# Patient Record
Sex: Female | Born: 1970 | Race: White | Hispanic: No | Marital: Married | Smoking: Never smoker
Health system: Southern US, Community
[De-identification: ages and names within clinical notes are randomized; demographics above are authoritative.]

## PROBLEM LIST (undated history)

## (undated) DIAGNOSIS — D691 Qualitative platelet defects: Secondary | ICD-10-CM

## (undated) DIAGNOSIS — R112 Nausea with vomiting, unspecified: Secondary | ICD-10-CM

## (undated) DIAGNOSIS — C801 Malignant (primary) neoplasm, unspecified: Secondary | ICD-10-CM

## (undated) DIAGNOSIS — D649 Anemia, unspecified: Secondary | ICD-10-CM

## (undated) DIAGNOSIS — Z9889 Other specified postprocedural states: Secondary | ICD-10-CM

## (undated) HISTORY — PX: OTHER SURGICAL HISTORY: SHX169

## (undated) HISTORY — PX: TUBAL LIGATION: SHX77

## (undated) HISTORY — PX: TONSILLECTOMY: SUR1361

## (undated) HISTORY — PX: CHOLECYSTECTOMY: SHX55

---

## 2014-01-25 ENCOUNTER — Encounter (HOSPITAL_BASED_OUTPATIENT_CLINIC_OR_DEPARTMENT_OTHER): Payer: Self-pay | Admitting: Emergency Medicine

## 2014-01-25 ENCOUNTER — Emergency Department (HOSPITAL_BASED_OUTPATIENT_CLINIC_OR_DEPARTMENT_OTHER)
Admission: EM | Admit: 2014-01-25 | Discharge: 2014-01-25 | Disposition: A | Discharge status: Home / Self Care | Payer: BC Managed Care – PPO | Attending: Emergency Medicine | Admitting: Emergency Medicine

## 2014-01-25 DIAGNOSIS — R350 Frequency of micturition: Secondary | ICD-10-CM | POA: Insufficient documentation

## 2014-01-25 DIAGNOSIS — Z791 Long term (current) use of non-steroidal anti-inflammatories (NSAID): Secondary | ICD-10-CM | POA: Diagnosis not present

## 2014-01-25 DIAGNOSIS — Z79899 Other long term (current) drug therapy: Secondary | ICD-10-CM | POA: Insufficient documentation

## 2014-01-25 DIAGNOSIS — Z3202 Encounter for pregnancy test, result negative: Secondary | ICD-10-CM | POA: Diagnosis not present

## 2014-01-25 DIAGNOSIS — R300 Dysuria: Secondary | ICD-10-CM | POA: Diagnosis present

## 2014-01-25 DIAGNOSIS — N390 Urinary tract infection, site not specified: Secondary | ICD-10-CM | POA: Diagnosis not present

## 2014-01-25 LAB — URINALYSIS, ROUTINE W REFLEX MICROSCOPIC
Bilirubin Urine: NEGATIVE
GLUCOSE, UA: NEGATIVE mg/dL
KETONES UR: NEGATIVE mg/dL
Nitrite: NEGATIVE
PH: 6.5 (ref 5.0–8.0)
Protein, ur: 30 mg/dL — AB
Specific Gravity, Urine: 1.013 (ref 1.005–1.030)
Urobilinogen, UA: 0.2 mg/dL (ref 0.0–1.0)

## 2014-01-25 LAB — PREGNANCY, URINE: PREG TEST UR: NEGATIVE

## 2014-01-25 LAB — URINE MICROSCOPIC-ADD ON

## 2014-01-25 MED ORDER — NITROFURANTOIN MONOHYD MACRO 100 MG PO CAPS: 100.0000 mg | ORAL_CAPSULE | Freq: Two times a day (BID) | ORAL | Status: DC

## 2014-01-25 MED ORDER — PHENAZOPYRIDINE HCL 200 MG PO TABS
200.0000 mg | ORAL_TABLET | Freq: Three times a day (TID) | ORAL | Status: DC
Start: 2014-01-25 — End: 2016-10-25

## 2014-01-25 MED ORDER — NITROFURANTOIN MONOHYD MACRO 100 MG PO CAPS
100.0000 mg | ORAL_CAPSULE | Freq: Once | ORAL | Status: AC
  Administered 2014-01-25: 100 mg via ORAL
  Filled 2014-01-25: qty 1

## 2014-01-25 MED ORDER — PHENAZOPYRIDINE HCL 100 MG PO TABS
200.0000 mg | ORAL_TABLET | Freq: Once | ORAL | Status: AC
  Administered 2014-01-25: 200 mg via ORAL
  Filled 2014-01-25: qty 2

## 2014-01-25 MED ORDER — IBUPROFEN 800 MG PO TABS: 800.0000 mg | ORAL_TABLET | Freq: Three times a day (TID) | ORAL | Status: DC

## 2014-01-25 MED ORDER — IBUPROFEN 800 MG PO TABS
800.0000 mg | ORAL_TABLET | Freq: Once | ORAL | Status: AC
  Administered 2014-01-25: 800 mg via ORAL
  Filled 2014-01-25: qty 1

## 2014-01-25 NOTE — ED Notes (Signed)
Pt given grape juice and crackers per Dr. Randal Buba.

## 2014-01-25 NOTE — ED Provider Notes (Signed)
CSN: [id number]     Arrival date & time 01/25/14  0224 History   First MD Initiated Contact with Patient 01/25/14 0326     Chief Complaint  Patient presents with  . Dysuria     (Consider location/radiation/quality/duration/timing/severity/associated sxs/prior Treatment) Patient is a 43 y.o. female presenting with dysuria. The history is provided by the patient.  Dysuria Quality: pressure and spasm. Pain severity:  Moderate Onset quality:  Gradual Timing:  Constant Progression:  Worsening Chronicity:  New Recent urinary tract infections: no   Relieved by:  Nothing Worsened by:  Nothing tried Ineffective treatments:  None tried Urinary symptoms: frequent urination and hesitancy   Associated symptoms: no fever and no vaginal discharge   Risk factors: no hx of pyelonephritis     History reviewed. No pertinent past medical history. Past Surgical History  Procedure Laterality Date  . Tonsillectomy    . Cholecystectomy    . Tubal ligation     History reviewed. No pertinent family history. History  Substance Use Topics  . Smoking status: Never Smoker   . Smokeless tobacco: Not on file  . Alcohol Use: No   OB History   Grav Para Term Preterm Abortions TAB SAB Ect Mult Living                 Review of Systems  Constitutional: Negative for fever.  Genitourinary: Positive for dysuria, frequency and difficulty urinating. Negative for vaginal discharge.  All other systems reviewed and are negative.     Allergies  Codeine and Erythromycin  Home Medications   Prior to Admission medications   Medication Sig Start Date End Date Taking? Authorizing Provider  progesterone (PROMETRIUM) 100 MG capsule Take 100 mg by mouth daily.   Yes Historical Provider, MD  ibuprofen (ADVIL,MOTRIN) 800 MG tablet Take 1 tablet (800 mg total) by mouth 3 (three) times daily. 01/25/14   Shavawn Stobaugh K Dawsen Krieger-Rasch, MD  nitrofurantoin, macrocrystal-monohydrate, (MACROBID) 100 MG capsule Take 1  capsule (100 mg total) by mouth 2 (two) times daily. X 7 days 01/25/14   Ayaansh Smail K Neilah Fulwider-Rasch, MD  phenazopyridine (PYRIDIUM) 200 MG tablet Take 1 tablet (200 mg total) by mouth 3 (three) times daily. 01/25/14   Judas Mohammad K Donovin Kraemer-Rasch, MD   BP 123/77  Pulse 75  Temp(Src) 98.2 F (36.8 C) (Oral)  Resp 18  Ht 5\' 5"  (1.651 m)  Wt 135 lb (61.236 kg)  BMI 22.47 kg/m2  SpO2 100%  LMP 01/20/2014 Physical Exam  Constitutional: She is oriented to person, place, and time. She appears well-developed and well-nourished.  HENT:  Head: Normocephalic and atraumatic.  Mouth/Throat: Oropharynx is clear and moist.  Eyes: Conjunctivae are normal. Pupils are equal, round, and reactive to light.  Neck: Normal range of motion. Neck supple.  Cardiovascular: Normal rate, regular rhythm and intact distal pulses.   Pulmonary/Chest: Effort normal and breath sounds normal. She has no wheezes. She has no rales.  Abdominal: Soft. Bowel sounds are normal. There is no tenderness. There is no rebound and no guarding.  Musculoskeletal: Normal range of motion.  Neurological: She is alert and oriented to person, place, and time.  Skin: Skin is warm and dry.  Psychiatric: She has a normal mood and affect.    ED Course  Procedures (including critical care time) Labs Review Labs Reviewed  URINALYSIS, ROUTINE W REFLEX MICROSCOPIC - Abnormal; Notable for the following:    Color, Urine RED (*)    APPearance CLOUDY (*)    Hgb urine dipstick LARGE (*)  Protein, ur 30 (*)    Leukocytes, UA LARGE (*)    All other components within normal limits  PREGNANCY, URINE  URINE MICROSCOPIC-ADD ON    Imaging Review No results found.   EKG Interpretation None      MDM   Final diagnoses:  UTI (lower urinary tract infection)    Patient is having her menstrual cycle and the specimen was not an in and out cath.  Blood is not urinary but menstrual.    macrobid x 7 days pyridium and ibuprofen TID on a full stomach.   Return for vomiting or fevers recheck in 7 days with your regular doctor patient and husband verbalize understanding and agree to follow up    Lorenso Quirino Alfonso Patten, MD 01/25/14 [phone]

## 2014-01-25 NOTE — ED Notes (Signed)
Pt c/o difficulty urinating and lower abd pressure x1 day that became worse tonight. Pt sts she is on day 6 of her period and she is bleeding more than usual.

## 2014-01-25 NOTE — ED Notes (Signed)
MD at bedside. 

## 2014-05-07 ENCOUNTER — Other Ambulatory Visit (HOSPITAL_COMMUNITY)
Admission: RE | Admit: 2014-05-07 | Discharge: 2014-05-07 | Disposition: A | Discharge status: Home / Self Care | Payer: BC Managed Care – PPO | Source: Ambulatory Visit | Attending: Obstetrics & Gynecology | Admitting: Obstetrics & Gynecology

## 2014-05-07 DIAGNOSIS — Z01419 Encounter for gynecological examination (general) (routine) without abnormal findings: Secondary | ICD-10-CM | POA: Diagnosis present

## 2014-05-07 DIAGNOSIS — Z1151 Encounter for screening for human papillomavirus (HPV): Secondary | ICD-10-CM | POA: Insufficient documentation

## 2015-06-05 ENCOUNTER — Other Ambulatory Visit: Payer: Self-pay

## 2015-06-05 DIAGNOSIS — Z1231 Encounter for screening mammogram for malignant neoplasm of breast: Secondary | ICD-10-CM

## 2015-07-08 ENCOUNTER — Ambulatory Visit
Admission: RE | Admit: 2015-07-08 | Discharge: 2015-07-08 | Disposition: A | Discharge status: Home / Self Care | Payer: BC Managed Care – PPO | Source: Ambulatory Visit

## 2015-07-08 DIAGNOSIS — Z1231 Encounter for screening mammogram for malignant neoplasm of breast: Secondary | ICD-10-CM

## 2015-07-09 ENCOUNTER — Ambulatory Visit: Payer: BC Managed Care – PPO

## 2016-08-11 ENCOUNTER — Other Ambulatory Visit: Payer: Self-pay | Admitting: Obstetrics & Gynecology

## 2016-08-11 DIAGNOSIS — N6320 Unspecified lump in the left breast, unspecified quadrant: Secondary | ICD-10-CM

## 2016-08-16 ENCOUNTER — Ambulatory Visit
Admission: RE | Admit: 2016-08-16 | Discharge: 2016-08-16 | Disposition: A | Discharge status: Home / Self Care | Payer: BC Managed Care – PPO | Source: Ambulatory Visit | Attending: Obstetrics & Gynecology | Admitting: Obstetrics & Gynecology

## 2016-08-16 DIAGNOSIS — N6320 Unspecified lump in the left breast, unspecified quadrant: Secondary | ICD-10-CM

## 2016-10-24 ENCOUNTER — Encounter (HOSPITAL_COMMUNITY): Payer: Self-pay | Admitting: *Deleted

## 2016-10-25 NOTE — H&P (Signed)
46yo G2P2 who presents for preop appt for [facility], D&C, uterine polypectomy on 8/3 due to AUB due to uterine polyp. In review, while she has a h/o HMB, which has been well controlled with progesterone. Recently she has noted a change in her bleeding. She reports intermenstrual bleeding- light pink spotting when she urinates throughout the month negative. Frequency unpredictable usually the week after her period and then once she starts the progesterone it resolves. Today she notes no changes from her previous visit. In terms of her menses, she does report that the bleeding while manageable is still heavy- menses last about 7 days, but manageable to her, about 3 heavy days. Reports no acute complaints. No sexual dysfunction. No hot flashes/night sweat. No vaginal discharge, itching or burning.  Last US/SHG 09/14/2016: 9.8cm uterus with 4 small submucosal and intramural fibroids- largest 1.7cm. 2.6cm hypoechoic mass-fundal wall. Left ovary with 2.7cm simple cyst- avascular. Normal right ovary. Prior work up included:  EMB 06/2015- prolif. endometrium with polyp.   Current Medications  Taking   Zyrtec Allergy(Cetirizine HCl) 10 MG Tablet 1 tablet Orally as needed, Notes: seasonal   Progesterone 200 MG Capsule 1 capsule at bedtime Orally Once a day on days 14-25 of cycle.   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   Seasonal allergies.           Surgical History  tonsillectomy 1991  cholecystectomy 1995  BTL 2014  basal cell x 3    Family History  Mother: alive, MI(67), osteoporosis, hypertension  Paternal Brandon Father: deceased  Paternal Grand Mother: deceased  Maternal Grand Father: deceased  Maternal Grand Mother: deceased  denies any GYN family cancer hx.   Social History  General:  Tobacco use   cigarettes: Never smoked Tobacco history last updated 10/25/2016 no EXPOSURE TO PASSIVE SMOKE.  Alcohol: none.  Recreational drug use: no.  Exercise: 1-2 times per week.  Marital Status: married.  Children: 1 daughter Ali Lowe), I son Marshell Levan); 1 stepson Wille Celeste), 1 stepdaughter Salli Real).  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  Religion: Christian.  Seat belt use: yes.    Gyn History  Sexual activity currently sexually active.  Periods : every month.  LMP 10/01/16.  Birth control BTL.  Last pap smear date 05/07/2014.  Last mammogram date 08/16/16.  Denies H/O Abnormal pap smear.    OB History  Pregnancy # 1 live birth, vaginal delivery.  Pregnancy # 2 live birth, vaginal delivery.    Allergies  Erythromycin: vomiting  Codeine (for allergy): vomiting   Hospitalization/Major Diagnostic Procedure  No Hospitalization History.   Review of Systems  CONSTITUTIONAL:  no Appetite changes. no Chills. no Fatigue. no Fever.  CARDIOLOGY:  no Chest pain.  RESPIRATORY:  no Shortness of breath. no Cough.  UROLOGY:  no Dysuria. no Urinary frequency. no Urinary incontinence. no Urinary urgency.  GASTROENTEROLOGY:  no Abdominal pain. no Change in bowel habits. no Change in bowel movements.  FEMALE REPRODUCTIVE:  no Abnormal vaginal discharge. no Breast lumps or discharge. no Breast pain. no Hot flashes. no Sexual problems. no Vaginal irritation. no Vaginal itching.  NEUROLOGY:  no Dizziness. no Headache.  PSYCHOLOGY:  no Anxiety. no Depression.  SKIN:  no Rash. no Hives.  HEMATOLOGY/LYMPH:  no Anemia. Using Blood Thinners no.  Vital Signs  Wt 133, Ht 65.25, BMI 21.96, Pulse sitting 79, BP sitting 104/77.   Examination  General Examination: GENERAL APPEARANCE well developed, well nourished .  SKIN: warm and dry, no rashes .  NECK: supple, normal appearance .  LUNGS: clear to auscultation bilaterally, no wheezes, rhonchi, rales.  HEART: no murmurs, regular  rate and rhythm.  ABDOMEN: soft and not tender, no masses palpated, no rebound, no rigidity.  MUSCULOSKELETAL no calf tenderness bilaterally .  EXTREMITIES: no edema present .  NEUROLOGIC EXAM: alert and oriented x 3.  PSYCH: appropriate mood and affect .      46yo G2P2 who presents for scheduled hysteroscopy, D&C, polypectomy -NPO -LR @ 125cc/hr -SCDs to OR - Discussed procedure and reviewed risk, benefit including but not limited to risk of bleeding infection and injury to uterus or surrounding organs. Questions and concerns were addressed and pt voiced understanding, inform consent obtained.  Janyth Pupa, DO [phone] (pager) [phone] (office)

## 2016-10-28 ENCOUNTER — Encounter (HOSPITAL_COMMUNITY): Payer: Self-pay

## 2016-10-28 ENCOUNTER — Encounter (HOSPITAL_COMMUNITY)
Admission: RE | Disposition: A | Discharge status: Home / Self Care | Payer: Self-pay | Source: Ambulatory Visit | Attending: Obstetrics & Gynecology

## 2016-10-28 ENCOUNTER — Ambulatory Visit (HOSPITAL_COMMUNITY): Payer: BC Managed Care – PPO | Admitting: Anesthesiology

## 2016-10-28 ENCOUNTER — Ambulatory Visit (HOSPITAL_COMMUNITY)
Admission: RE | Admit: 2016-10-28 | Discharge: 2016-10-28 | Disposition: A | Discharge status: Home / Self Care | Payer: BC Managed Care – PPO | Source: Ambulatory Visit | Attending: Obstetrics & Gynecology | Admitting: Obstetrics & Gynecology

## 2016-10-28 DIAGNOSIS — Z9851 Tubal ligation status: Secondary | ICD-10-CM | POA: Insufficient documentation

## 2016-10-28 DIAGNOSIS — N840 Polyp of corpus uteri: Secondary | ICD-10-CM | POA: Insufficient documentation

## 2016-10-28 DIAGNOSIS — Z9889 Other specified postprocedural states: Secondary | ICD-10-CM | POA: Diagnosis not present

## 2016-10-28 DIAGNOSIS — N939 Abnormal uterine and vaginal bleeding, unspecified: Secondary | ICD-10-CM | POA: Insufficient documentation

## 2016-10-28 DIAGNOSIS — D691 Qualitative platelet defects: Secondary | ICD-10-CM | POA: Diagnosis not present

## 2016-10-28 DIAGNOSIS — Z85828 Personal history of other malignant neoplasm of skin: Secondary | ICD-10-CM | POA: Diagnosis not present

## 2016-10-28 DIAGNOSIS — J302 Other seasonal allergic rhinitis: Secondary | ICD-10-CM | POA: Diagnosis not present

## 2016-10-28 DIAGNOSIS — Z9049 Acquired absence of other specified parts of digestive tract: Secondary | ICD-10-CM | POA: Insufficient documentation

## 2016-10-28 DIAGNOSIS — Z8249 Family history of ischemic heart disease and other diseases of the circulatory system: Secondary | ICD-10-CM | POA: Insufficient documentation

## 2016-10-28 DIAGNOSIS — Z79899 Other long term (current) drug therapy: Secondary | ICD-10-CM | POA: Diagnosis not present

## 2016-10-28 DIAGNOSIS — Z8262 Family history of osteoporosis: Secondary | ICD-10-CM | POA: Diagnosis not present

## 2016-10-28 HISTORY — DX: Other specified postprocedural states: Z98.890

## 2016-10-28 HISTORY — PX: DILATATION & CURETTAGE/HYSTEROSCOPY WITH MYOSURE: SHX6511

## 2016-10-28 HISTORY — DX: Anemia, unspecified: D64.9

## 2016-10-28 HISTORY — DX: Malignant (primary) neoplasm, unspecified: C80.1

## 2016-10-28 HISTORY — DX: Nausea with vomiting, unspecified: R11.2

## 2016-10-28 HISTORY — DX: Qualitative platelet defects: D69.1

## 2016-10-28 LAB — CBC
HEMATOCRIT: 40 % (ref 36.0–46.0)
HEMOGLOBIN: 13.2 g/dL (ref 12.0–15.0)
MCH: 29.1 pg (ref 26.0–34.0)
MCHC: 33 g/dL (ref 30.0–36.0)
MCV: 88.1 fL (ref 78.0–100.0)
Platelets: 300 10*3/uL (ref 150–400)
RBC: 4.54 MIL/uL (ref 3.87–5.11)
RDW: 13.4 % (ref 11.5–15.5)
WBC: 7.1 10*3/uL (ref 4.0–10.5)

## 2016-10-28 LAB — BASIC METABOLIC PANEL
Anion gap: 7 (ref 5–15)
BUN: 12 mg/dL (ref 6–20)
CHLORIDE: 103 mmol/L (ref 101–111)
CO2: 27 mmol/L (ref 22–32)
CREATININE: 0.65 mg/dL (ref 0.44–1.00)
Calcium: 9.5 mg/dL (ref 8.9–10.3)
GFR calc non Af Amer: >60 mL/min (ref >60)
Glucose, Bld: 95 mg/dL (ref 65–99)
POTASSIUM: 4.7 mmol/L (ref 3.5–5.1)
Sodium: 137 mmol/L (ref 135–145)

## 2016-10-28 SURGERY — DILATATION & CURETTAGE/HYSTEROSCOPY WITH MYOSURE
Anesthesia: General

## 2016-10-28 MED ORDER — DEXAMETHASONE SODIUM PHOSPHATE 10 MG/ML IJ SOLN
INTRAMUSCULAR | Status: AC
  Filled 2016-10-28: qty 1

## 2016-10-28 MED ORDER — SCOPOLAMINE 1 MG/3DAYS TD PT72
1.0000 | MEDICATED_PATCH | Freq: Once | TRANSDERMAL | Status: DC
  Administered 2016-10-28: 1.5 mg via TRANSDERMAL

## 2016-10-28 MED ORDER — KETOROLAC TROMETHAMINE 30 MG/ML IJ SOLN
INTRAMUSCULAR | Status: AC
  Filled 2016-10-28: qty 1

## 2016-10-28 MED ORDER — ACETAMINOPHEN 160 MG/5ML PO SOLN
975.0000 mg | Freq: Four times a day (QID) | ORAL | Status: AC | PRN
  Administered 2016-10-28: 975 mg via ORAL

## 2016-10-28 MED ORDER — ACETAMINOPHEN 160 MG/5ML PO SOLN
ORAL | Status: AC
  Administered 2016-10-28: 975 mg via ORAL
  Filled 2016-10-28: qty 40.6

## 2016-10-28 MED ORDER — FENTANYL CITRATE (PF) 100 MCG/2ML IJ SOLN
INTRAMUSCULAR | Status: DC | PRN
  Administered 2016-10-28 (×3): 50 ug via INTRAVENOUS

## 2016-10-28 MED ORDER — SODIUM CHLORIDE 0.9 % IR SOLN
Status: DC | PRN
  Administered 2016-10-28: 3000 mL

## 2016-10-28 MED ORDER — PROMETHAZINE HCL 25 MG/ML IJ SOLN: 6.2500 mg | INTRAMUSCULAR | Status: DC | PRN

## 2016-10-28 MED ORDER — KETOROLAC TROMETHAMINE 30 MG/ML IJ SOLN
INTRAMUSCULAR | Status: DC | PRN
  Administered 2016-10-28: 30 mg via INTRAVENOUS

## 2016-10-28 MED ORDER — DEXAMETHASONE SODIUM PHOSPHATE 10 MG/ML IJ SOLN
INTRAMUSCULAR | Status: DC | PRN
  Administered 2016-10-28: 8 mg via INTRAVENOUS

## 2016-10-28 MED ORDER — SCOPOLAMINE 1 MG/3DAYS TD PT72
MEDICATED_PATCH | TRANSDERMAL | Status: AC
  Administered 2016-10-28: 1.5 mg via TRANSDERMAL
  Filled 2016-10-28: qty 1

## 2016-10-28 MED ORDER — PROPOFOL 10 MG/ML IV BOLUS
INTRAVENOUS | Status: DC | PRN
  Administered 2016-10-28: 170 mg via INTRAVENOUS

## 2016-10-28 MED ORDER — FENTANYL CITRATE (PF) 250 MCG/5ML IJ SOLN
INTRAMUSCULAR | Status: AC
  Filled 2016-10-28: qty 5

## 2016-10-28 MED ORDER — MIDAZOLAM HCL 2 MG/2ML IJ SOLN
INTRAMUSCULAR | Status: AC
Start: 2016-10-28 — End: 2016-10-28
  Filled 2016-10-28: qty 2

## 2016-10-28 MED ORDER — LIDOCAINE-EPINEPHRINE 1 %-1:100000 IJ SOLN
INTRAMUSCULAR | Status: DC | PRN
  Administered 2016-10-28: 20 mL

## 2016-10-28 MED ORDER — ONDANSETRON HCL 4 MG/2ML IJ SOLN
INTRAMUSCULAR | Status: DC | PRN
  Administered 2016-10-28: 4 mg via INTRAVENOUS

## 2016-10-28 MED ORDER — LIDOCAINE-EPINEPHRINE 1 %-1:100000 IJ SOLN
INTRAMUSCULAR | Status: AC
  Filled 2016-10-28: qty 1

## 2016-10-28 MED ORDER — FENTANYL CITRATE (PF) 100 MCG/2ML IJ SOLN: 25.0000 ug | INTRAMUSCULAR | Status: DC | PRN

## 2016-10-28 MED ORDER — PROPOFOL 10 MG/ML IV BOLUS
INTRAVENOUS | Status: AC
  Filled 2016-10-28: qty 20

## 2016-10-28 MED ORDER — MIDAZOLAM HCL 2 MG/2ML IJ SOLN
INTRAMUSCULAR | Status: DC | PRN
  Administered 2016-10-28: 1 mg via INTRAVENOUS

## 2016-10-28 MED ORDER — GLYCOPYRROLATE 0.2 MG/ML IJ SOLN
INTRAMUSCULAR | Status: DC | PRN
  Administered 2016-10-28: 0.1 mg via INTRAVENOUS

## 2016-10-28 MED ORDER — ONDANSETRON HCL 4 MG/2ML IJ SOLN
INTRAMUSCULAR | Status: AC
  Filled 2016-10-28: qty 2

## 2016-10-28 MED ORDER — LIDOCAINE HCL (CARDIAC) 20 MG/ML IV SOLN
INTRAVENOUS | Status: DC | PRN
  Administered 2016-10-28: 80 mg via INTRAVENOUS

## 2016-10-28 MED ORDER — LACTATED RINGERS IV SOLN
INTRAVENOUS | Status: DC
  Administered 2016-10-28: 125 mL/h via INTRAVENOUS

## 2016-10-28 SURGICAL SUPPLY — 21 items
CANISTER SUCT 3000ML PPV (MISCELLANEOUS) ×3 IMPLANT
CATH ROBINSON RED A/P 16FR (CATHETERS) ×3 IMPLANT
CLOTH BEACON ORANGE TIMEOUT ST (SAFETY) ×3 IMPLANT
CONTAINER PREFILL 10% NBF 60ML (FORM) ×6 IMPLANT
DEVICE MYOSURE LITE (MISCELLANEOUS) IMPLANT
DEVICE MYOSURE REACH (MISCELLANEOUS) ×3 IMPLANT
DILATOR CANAL MILEX (MISCELLANEOUS) IMPLANT
GLOVE BIOGEL PI IND STRL 6.5 (GLOVE) ×1 IMPLANT
GLOVE BIOGEL PI IND STRL 7.0 (GLOVE) ×1 IMPLANT
GLOVE BIOGEL PI INDICATOR 6.5 (GLOVE) ×2
GLOVE BIOGEL PI INDICATOR 7.0 (GLOVE) ×2
GLOVE ECLIPSE 6.5 STRL STRAW (GLOVE) ×3 IMPLANT
GOWN STRL REUS W/TWL LRG LVL3 (GOWN DISPOSABLE) ×6 IMPLANT
MYOSURE XL FIBROID REM (MISCELLANEOUS) ×3
PACK VAGINAL MINOR WOMEN LF (CUSTOM PROCEDURE TRAY) ×3 IMPLANT
PAD OB MATERNITY 4.3X12.25 (PERSONAL CARE ITEMS) ×3 IMPLANT
SEAL ROD LENS SCOPE MYOSURE (ABLATOR) ×3 IMPLANT
SYSTEM TISS REMOVAL MYSR XL RM (MISCELLANEOUS) ×1 IMPLANT
TOWEL OR 17X24 6PK STRL BLUE (TOWEL DISPOSABLE) ×6 IMPLANT
TUBING AQUILEX INFLOW (TUBING) ×3 IMPLANT
TUBING AQUILEX OUTFLOW (TUBING) ×3 IMPLANT

## 2016-10-28 NOTE — Anesthesia Procedure Notes (Signed)
Procedure Name: LMA Insertion Date/Time: 10/28/2016 11:03 AM Performed by: Georgeanne Nim Pre-anesthesia Checklist: Patient identified, Patient being monitored, Emergency Drugs available, Timeout performed and Suction available Patient Re-evaluated:Patient Re-evaluated prior to induction Oxygen Delivery Method: Circle system utilized Preoxygenation: Pre-oxygenation with 100% oxygen Induction Type: IV induction Ventilation: Mask ventilation without difficulty LMA: LMA inserted LMA Size: 4.0 Laser Tube: Cuffed inflated with minimal occlusive pressure - saline Number of attempts: 1 Placement Confirmation: positive ETCO2,  CO2 detector and breath sounds checked- equal and bilateral Tube secured with: Tape Dental Injury: Teeth and Oropharynx as per pre-operative assessment

## 2016-10-28 NOTE — Discharge Instructions (Addendum)
°  Post Anesthesia Home Care Instructions  Activity: Get plenty of rest for the remainder of the day. A responsible individual must stay with you for 24 hours following the procedure.  For the next 24 hours, DO NOT: -Drive a car -Paediatric nurse -Drink alcoholic beverages -Take any medication unless instructed by your physician -Make any legal decisions or sign important papers.  Meals: Start with liquid foods such as gelatin or soup. Progress to regular foods as tolerated. Avoid greasy, spicy, heavy foods. If nausea and/or vomiting occur, drink only clear liquids until the nausea and/or vomiting subsides. Call your physician if vomiting continues.  Special Instructions/Symptoms: Your throat may feel dry or sore from the anesthesia or the breathing tube placed in your throat during surgery. If this causes discomfort, gargle with warm salt water. The discomfort should disappear within 24 hours.  If you had a scopolamine patch placed behind your ear for the management of post- operative nausea and/or vomiting:  1. The medication in the patch is effective for 72 hours, after which it should be removed.  Wrap patch in a tissue and discard in the trash. Wash hands thoroughly with soap and water. 2. You may remove the patch earlier than 72 hours if you experience unpleasant side effects which may include dry mouth, dizziness or visual disturbances. 3. Avoid touching the patch. Wash your hands with soap and water after contact with the patch.   HOME INSTRUCTIONS  Please note any unusual or excessive bleeding, pain, swelling. Mild dizziness or drowsiness are normal for about 24 hours after surgery.   Shower when comfortable  Restrictions: No driving for 24 hours or while taking pain medications.  Activity:  No heavy lifting (> 10 lbs), nothing in vagina (no tampons, douching, or intercourse) x 2 weeks; no tub baths for 2 weeks Vaginal spotting is expected but if your bleeding is heavy,  period like,  please call the office   Diet:  You may return to your regular diet.  Do not eat large meals.  Eat small frequent meals throughout the day.  Continue to drink a good amount of water at least 6-8 glasses of water per day, hydration is very important for the healing process.  Pain Management: Take Motrin and/or Tylenol as prescribed/needed for pain.  Always take prescription pain medication with food, it may cause constipation, increase fluids and fiber and you may want to take an over-the-counter stool softener like Colace as needed up to 2x a day.    Alcohol -- Avoid for 24 hours and while taking pain medications.  Nausea: Take sips of ginger ale or soda  Fever -- Call physician if temperature over 101 degrees  Follow up:  If you do not already have a follow up appointment scheduled, please call the office at [phone].  If you experience fever (a temperature greater than 100.4), pain unrelieved by pain medication, shortness of breath, swelling of a single leg, or any other symptoms which are concerning to you please the office immediately.

## 2016-10-28 NOTE — Op Note (Signed)
Operative Report  PreOp: Abnormal uterine bleeding PostOp: same and intrauterine fibroid Procedure:  Hysteroscopy, Dilation and Curettage, myosure myomectomy Surgeon: Dr. Janyth Pupa Anesthesia: General Complications:none EBL: 87GO UOP: 25cc IVF:1200cc    Findings:9cm anteverted uterus, bilateral ostia visualized.  3cm fundal uterine fibroids noted  Specimens: 1) endometrial curettings with fibroid  Procedure: The patient was taken to the operating room where she underwent general anesthesia without difficulty. The patient was placed in a low lithotomy position using Allen stirrups. The patient was examined with the findings as noted above.  She was then prepped and draped in the normal sterile fashion. The bladder was drained using a red rubber urethral catheter. A sterile speculum was inserted into the vagina. A single tooth tenaculum was placed on the anterior lip of the cervix. 1% Lidocaine with epi was used for a cervical block.  The uterus was then sounded to 9cm. The endocervical canal was then serially dilated to 14French using Hank dilators.  The diagnostic hysteroscope was then inserted without difficulty and noted to have the findings as listed above. The myosure was inserted and the mass was resected.  With resection, due to the hard white appearance, it was evident that the mass was a fibroid.  Additionally, a larger myosure device was used due to the difficulty removing the entire specimen. Visualization was achieved using NS as a distending medium. The hysteroscope was removed and sharp curettage was performed. The tissue was sent to pathology. All instrument were then removed. Hemostasis was observed at the cervical site. The patient was repositioned to the supine position. The patient tolerated the procedure without any complications and taken to recovery in stable condition.   Janyth Pupa, DO [phone] (pager) [phone] (office)

## 2016-10-28 NOTE — Interval H&P Note (Signed)
History and Physical Interval Note:  10/28/2016 10:27 AM  Jenny Warren  has presented today for surgery, with the diagnosis of N93.9 AUB N84.0 Uterine Polyp  The various methods of treatment have been discussed with the patient and family. After consideration of risks, benefits and other options for treatment, the patient has consented to  Procedure(s) with comments: [place] (N/A) - Polypectomy as a surgical intervention .  The patient's history has been reviewed, patient examined, no change in status, stable for surgery.  I have reviewed the patient's chart and labs.  Questions were answered to the patient's satisfaction.     Janyth Pupa, M

## 2016-10-28 NOTE — Transfer of Care (Signed)
Immediate Anesthesia Transfer of Care Note  Patient: Jenny Warren  Procedure(s) Performed: Procedure(s): DILATATION & CURETTAGE/HYSTEROSCOPY WITH MYOSURE (N/A)  Patient Location: PACU  Anesthesia Type:General  Level of Consciousness: awake, alert  and oriented  Airway & Oxygen Therapy: Patient Spontanous Breathing and Patient connected to nasal cannula oxygen  Post-op Assessment: Report given to RN and Post -op Vital signs reviewed and stable  Post vital signs: Reviewed and stable  Last Vitals:  Vitals:   10/28/16 0944  Pulse: 69  Temp: 36.6 C    Last Pain:  Vitals:   10/28/16 0944  TempSrc: Oral      Patients Stated Pain Goal: 3 (16/10/96 0454)  Complications: No apparent anesthesia complications

## 2016-10-28 NOTE — Anesthesia Postprocedure Evaluation (Signed)
Anesthesia Post Note  Patient: Engineer, building services  Procedure(s) Performed: Procedure(s) (LRB): DILATATION & CURETTAGE/HYSTEROSCOPY WITH MYOSURE (N/A)     Patient location during evaluation: PACU Anesthesia Type: General Level of consciousness: awake and alert Pain management: pain level controlled Vital Signs Assessment: post-procedure vital signs reviewed and stable Respiratory status: spontaneous breathing, nonlabored ventilation, respiratory function stable and patient connected to nasal cannula oxygen Cardiovascular status: blood pressure returned to baseline and stable Postop Assessment: no signs of nausea or vomiting Anesthetic complications: no    Last Vitals:  Vitals:   10/28/16 1315 10/28/16 1330  Pulse:    Resp: 14 14  Temp:      Last Pain:  Vitals:   10/28/16 1230  TempSrc:   PainSc: Asleep   Pain Goal: Patients Stated Pain Goal: 2 (10/28/16 1203)               Audry Pili

## 2016-10-28 NOTE — Anesthesia Preprocedure Evaluation (Addendum)
Anesthesia Evaluation  Patient identified by MRN, date of birth, ID band Patient awake    Reviewed: Allergy & Precautions, H&P , NPO status , Patient's Chart, lab work & pertinent test results, reviewed documented beta blocker date and time   History of Anesthesia Complications (+) PONV  Airway Mallampati: II  TM Distance: >3 FB Neck ROM: Full    Dental no notable dental hx. (+) Dental Advidsory Given   Pulmonary neg pulmonary ROS,    Pulmonary exam normal breath sounds clear to auscultation       Cardiovascular Exercise Tolerance: Good negative cardio ROS Normal cardiovascular exam Rhythm:Regular Rate:Normal     Neuro/Psych negative neurological ROS  negative psych ROS   GI/Hepatic negative GI ROS, Neg liver ROS,   Endo/Other  negative endocrine ROS  Renal/GU negative Renal ROS  negative genitourinary   Musculoskeletal   Abdominal   Peds  Hematology negative hematology ROS (+)   Anesthesia Other Findings   Reproductive/Obstetrics negative OB ROS                           Anesthesia Physical Anesthesia Plan  ASA: II  Anesthesia Plan: General   Post-op Pain Management:    Induction: Intravenous  PONV Risk Score and Plan: 3 and Ondansetron, Dexamethasone, Scopolamine patch - Pre-op and Treatment may vary due to age or medical condition  Airway Management Planned: LMA  Additional Equipment:   Intra-op Plan:   Post-operative Plan: Extubation in OR  Informed Consent: I have reviewed the patients History and Physical, chart, labs and discussed the procedure including the risks, benefits and alternatives for the proposed anesthesia with the patient or authorized representative who has indicated his/her understanding and acceptance.   Dental advisory given  Plan Discussed with: CRNA  Anesthesia Plan Comments:        Anesthesia Quick Evaluation

## 2016-10-29 ENCOUNTER — Encounter (HOSPITAL_COMMUNITY): Payer: Self-pay | Admitting: Obstetrics & Gynecology

## 2017-01-19 DIAGNOSIS — H938X2 Other specified disorders of left ear: Secondary | ICD-10-CM | POA: Insufficient documentation

## 2017-01-19 DIAGNOSIS — H9312 Tinnitus, left ear: Secondary | ICD-10-CM | POA: Insufficient documentation

## 2017-01-19 DIAGNOSIS — H9202 Otalgia, left ear: Secondary | ICD-10-CM | POA: Insufficient documentation

## 2018-03-07 ENCOUNTER — Ambulatory Visit
Admission: RE | Admit: 2018-03-07 | Discharge: 2018-03-07 | Disposition: A | Discharge status: Home / Self Care | Payer: BC Managed Care – PPO | Source: Ambulatory Visit | Attending: Sports Medicine | Admitting: Sports Medicine

## 2018-03-07 ENCOUNTER — Ambulatory Visit: Payer: BC Managed Care – PPO | Admitting: Sports Medicine

## 2018-03-07 ENCOUNTER — Encounter: Payer: Self-pay | Admitting: Sports Medicine

## 2018-03-07 VITALS — BP 132/88 | Ht 66.0 in | Wt 135.0 lb

## 2018-03-07 DIAGNOSIS — G8929 Other chronic pain: Secondary | ICD-10-CM

## 2018-03-07 DIAGNOSIS — M25561 Pain in right knee: Secondary | ICD-10-CM | POA: Diagnosis not present

## 2018-03-07 NOTE — Assessment & Plan Note (Signed)
-  Recommend an x-ray to rule out bone pathology for her pain -We will treat conservatively with 2-3 Advil, 3 times per day for the first 1 to 2 weeks.  As needed afterwards.  Counseled on common and severe side effects of long-term anti-inflammatory use. -After her x-rays performed, will consider the utility of a corticosteroid injection -Quadriceps and hamstring strengthening exercises given today to be completed for 4 to 6 weeks -If not improving, will need an MRI to evaluate possible medial meniscus pathology -Follow-up in 6 weeks

## 2018-03-07 NOTE — Progress Notes (Signed)
Jenny Warren - 47 y.o. female MRN [id number]  Date of birth: 1970-06-30   Chief complaint: Right knee pain  SUBJECTIVE:    History of present illness: 47 year old female who presents today with a chief complaint of ongoing right medial knee pain.  Her pain has been present for several years.  It used to be pain just deep to her patellar tendon.  She states today her pain has moved to the anterior medial aspect of her knee.  It is associated with intermittent swelling.  She states performing activities like walking long distances, hiking, or climbing stairs does worsen her symptoms.  She has tried 200 mg of ibuprofen which does improve her symptoms.  Denies any mechanical symptoms of locking, popping or giving out of her knee.  No injuries or trauma to her knee.  She denies any numbness or tingling of the extremity.  Denies any hip/groin pain or ankle pain associated with her current symptoms.  Pain is rated 5 out of 10 and is nonradiating. No low back pain.   Review of systems:  As stated above   Past medical history: Storage pooled disease of platelets Past surgical history: Cholecystectomy, tonsillectomy, wisdom teeth removal, tubal ligation, D&C Past family history: Osteoporosis in her mother at age 60 Social history: Non-smoker.  She works as a Pharmacist, hospital.  Medications: Zyrtec, multivitamin, progesterone capsule Allergies: Codeine and erythromycin  OBJECTIVE:  Physical exam: Vital signs are reviewed. BP 132/88   Ht 5\' 6"  (1.676 m)   Wt 135 lb (61.2 kg)   BMI 21.79 kg/m   Gen.: Alert, oriented, appears stated age, in no apparent distress Integumentary: No rashes or ecchymosis Neurologic:  Sensation is intact to light touch L4-S1 Gait: normal without associated limp, she does not use assistive devices for ambulation  Musculoskeletal: Inspection of the right knee demonstrates no acute abnormalities.  She does have medial joint line tenderness to palpation.  No patellofemoral  crepitus with motion.  She has full range of motion knee flexion and extension.  Strength testing is 5 out of 5 in knee flexion and extension.  She has a negative anterior posterior drawer test.  Negative Lockman test.  Negative McMurray's test.  Negative Thessaly test.  Knee is stable to varus valgus stress.  She is neurovascularly intact.    ASSESSMENT & PLAN: Chronic pain of right knee -Recommend an x-ray to rule out bone pathology for her pain -We will treat conservatively with 2-3 Advil, 3 times per day for the first 1 to 2 weeks.  As needed afterwards.  Counseled on common and severe side effects of long-term anti-inflammatory use. -After her x-rays performed, will consider the utility of a corticosteroid injection -Quadriceps and hamstring strengthening exercises given today to be completed for 4 to 6 weeks -If not improving, will need an MRI to evaluate possible medial meniscus pathology -Follow-up in 6 weeks   Orders Placed This Encounter  Procedures  . DG Knee AP/LAT W/Sunrise Right    Standing Status:   Future    Standing Expiration Date:   05/09/2019    Order Specific Question:   Reason for Exam (SYMPTOM  OR DIAGNOSIS REQUIRED)    Answer:   right knee pain; standing AP, lateral and sunrise views    Order Specific Question:   Is patient pregnant?    Answer:   No    Order Specific Question:   Preferred imaging location?    Answer:   [facility]    Order Specific Question:  Radiology Contrast Protocol - do NOT remove file path    Answer:   [email or web address]    Clydene Laming, [place]

## 2018-09-03 DIAGNOSIS — J340 Abscess, furuncle and carbuncle of nose: Secondary | ICD-10-CM | POA: Insufficient documentation

## 2019-08-01 ENCOUNTER — Ambulatory Visit: Payer: BC Managed Care – PPO | Admitting: Podiatry

## 2019-08-01 ENCOUNTER — Other Ambulatory Visit: Payer: Self-pay

## 2019-08-01 ENCOUNTER — Encounter: Payer: Self-pay | Admitting: Podiatry

## 2019-08-01 ENCOUNTER — Ambulatory Visit (INDEPENDENT_AMBULATORY_CARE_PROVIDER_SITE_OTHER): Payer: BC Managed Care – PPO

## 2019-08-01 DIAGNOSIS — T1490XA Injury, unspecified, initial encounter: Secondary | ICD-10-CM

## 2019-08-01 DIAGNOSIS — S93501A Unspecified sprain of right great toe, initial encounter: Secondary | ICD-10-CM

## 2019-08-01 DIAGNOSIS — M84374A Stress fracture, right foot, initial encounter for fracture: Secondary | ICD-10-CM | POA: Diagnosis not present

## 2019-08-07 NOTE — Progress Notes (Signed)
Subjective:   Patient ID: Jenny Warren, female   DOB: 49 y.o.   MRN: [id number]   HPI 49 year old female presents the office today for concerns of possible fracture of the right foot.  She states that she twisted her foot but not her ankle.  This happened over the weekend and became more tender on Sunday.  It hurts with and without a shoe.  Has been swelling on the instep she reports.  She denies any popping sensation.  The majority tenderness is the big toe area.  She is try to stay off her foot.  Still having pain.   Review of Systems  All other systems reviewed and are negative.  Past Medical History:  Diagnosis Date  . Anemia   . Cancer ([place])    Basal cell - on nose - removed  . PONV (postoperative nausea and vomiting)   . 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 SVD (spontaneous vaginal delivery)    x 2    Past Surgical History:  Procedure Laterality Date  . CHOLECYSTECTOMY    . DILATATION & CURETTAGE/HYSTEROSCOPY WITH MYOSURE N/A 10/28/2016   Procedure: DILATATION & CURETTAGE/HYSTEROSCOPY WITH MYOSURE;  Surgeon: Janyth Pupa, DO;  Location: [facility];  Service: Gynecology;  Laterality: N/A;  . TONSILLECTOMY    . TUBAL LIGATION    . wisdom teeth ext       Current Outpatient Medications:  Marland Kitchen  VITAMIN D PO, Take by mouth., Disp: , Rfl:  .  progesterone (PROMETRIUM) 200 MG capsule, Take 200 mg by mouth daily. For 2 weeks out of the month, Disp: , Rfl: 3  Allergies  Allergen Reactions  . Codeine Nausea And Vomiting  . Erythromycin Nausea And Vomiting        Objective:  Physical Exam  General: AAO x3, NAD  Dermatological: Skin is warm, dry and supple bilateral. Nails x 10 are well manicured; remaining integument appears unremarkable at this time. There are no open sores, no preulcerative lesions, no rash or signs of infection present.  Vascular: Dorsalis Pedis artery and Posterior Tibial artery pedal pulses are 2/4 bilateral with immedate capillary fill time. Pedal  hair growth present. No varicosities and no lower extremity edema present bilateral. There is no pain with calf compression, swelling, warmth, erythema.   Neruologic: Grossly intact via light touch bilateral.   Musculoskeletal: There is tenderness with first MPJ range of motion of the right foot.  Mild tenderness on the second metatarsal directly on the diaphysis.  There is mild edema there is no erythema or warmth.  There are no other areas of pinpoint tenderness.  Flexor, extensor tendons appear to be intact.  Muscular strength 5/5 in all groups tested bilateral.  Gait: Unassisted, Nonantalgic.       Assessment:   Sprain right first MPJ, concern for stress fracture     Plan:  -Treatment options discussed including all alternatives, risks, and complications -Etiology of symptoms were discussed -X-rays were obtained and reviewed with the patient.  No definitive evidence of acute fracture identified. -Given the pain in the location recommend elevation.  Cam boot was dispensed.  Ice elevation.  Anti-inflammatories as needed    Trula Slade DPM

## 2019-09-10 ENCOUNTER — Other Ambulatory Visit: Payer: Self-pay

## 2019-09-10 ENCOUNTER — Ambulatory Visit: Payer: BC Managed Care – PPO | Admitting: Podiatry

## 2019-09-10 DIAGNOSIS — M722 Plantar fascial fibromatosis: Secondary | ICD-10-CM

## 2019-09-10 DIAGNOSIS — S93501D Unspecified sprain of right great toe, subsequent encounter: Secondary | ICD-10-CM | POA: Diagnosis not present

## 2019-09-10 MED ORDER — DICLOFENAC SODIUM 1 % EX GEL: 2.0000 g | Freq: Four times a day (QID) | CUTANEOUS | 2 refills | Status: AC

## 2019-09-10 NOTE — Patient Instructions (Signed)

## 2019-09-16 NOTE — Progress Notes (Signed)
Subjective: 49 year old female presents the office today for follow-up evaluation of right foot pain.  She states that she is 70% improved and she is no longer wearing her boot.  She points on the arch of the foot distally where she has majority discomfort.  No significant swelling or any redness.  No other concerns today. Denies any systemic complaints such as fevers, chills, nausea, vomiting. No acute changes since last appointment.  Objective: AAO x3, NAD DP/PT pulses palpable bilaterally, CRT less than 3 seconds On today's exam there is no area of tenderness on the first MPJ on the second metatarsal.  There is no edema to this area as well.  No area pinpoint tenderness.  The tenderness is localized on the medial band plantar fascia distally.  The ligament appears to be intact.  Ankle, subtalar, MPJ range of motion intact. No pain with calf compression, swelling, warmth, erythema  Assessment: Plantar fasciitis, distally  Plan: -All treatment options discussed with the patient including all alternatives, risks, complications.  -The pinpoint tenderness and bony tenderness has resolved.  The joint tenderness is also resolved.  The majority tenderness is on the medial plantar fascia along the attachment distally.  We discussed stretching, icing daily.  Discussed wearing supportive shoes.  Anti-inflammatories as needed. -Patient encouraged to call the office with any questions, concerns, change in symptoms.   Trula Slade DPM

## 2019-10-03 ENCOUNTER — Telehealth: Payer: Self-pay | Admitting: *Deleted

## 2019-10-03 NOTE — Telephone Encounter (Signed)
Called patient to see how she was doing and patient stated that she was much better and never did get the voltaren gel and I stated that you can buy the gel over the counter. Jenny Warren

## 2020-09-11 ENCOUNTER — Other Ambulatory Visit: Payer: Self-pay | Admitting: Obstetrics and Gynecology

## 2020-09-11 DIAGNOSIS — Z1231 Encounter for screening mammogram for malignant neoplasm of breast: Secondary | ICD-10-CM

## 2020-09-14 ENCOUNTER — Ambulatory Visit: Payer: BC Managed Care – PPO

## 2020-09-25 ENCOUNTER — Ambulatory Visit
Admission: RE | Admit: 2020-09-25 | Discharge: 2020-09-25 | Disposition: A | Discharge status: Home / Self Care | Payer: Self-pay | Source: Ambulatory Visit | Attending: Obstetrics and Gynecology | Admitting: Obstetrics and Gynecology

## 2020-09-25 ENCOUNTER — Other Ambulatory Visit: Payer: Self-pay

## 2020-09-25 DIAGNOSIS — Z1231 Encounter for screening mammogram for malignant neoplasm of breast: Secondary | ICD-10-CM

## 2021-06-03 ENCOUNTER — Ambulatory Visit: Payer: BC Managed Care – PPO | Admitting: Podiatry

## 2022-06-01 IMAGING — MG MM DIGITAL SCREENING BILAT W/ TOMO AND CAD
8 series · 9 of 24 positions shown · non-contrast
Comparison: Previous exam(s).

CLINICAL DATA: Screening.

EXAM:
DIGITAL SCREENING BILATERAL MAMMOGRAM WITH TOMOSYNTHESIS AND CAD
TECHNIQUE: Bilateral screening digital craniocaudal and mediolateral oblique
mammograms were obtained. Bilateral screening digital breast
tomosynthesis was performed. The images were evaluated with
computer-aided detection.

[L MLO synth-2D]
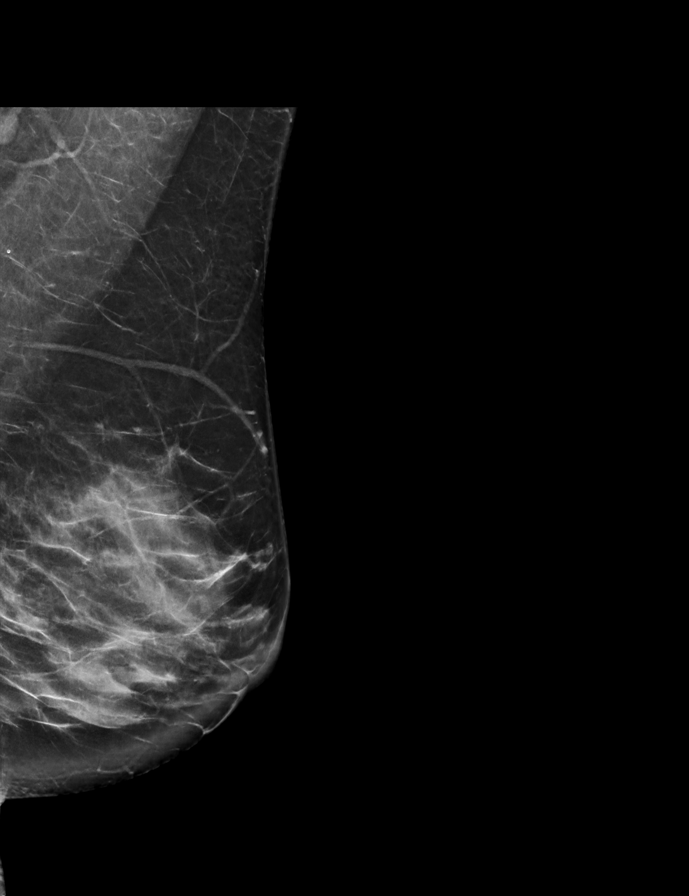

[L CC synth-2D]
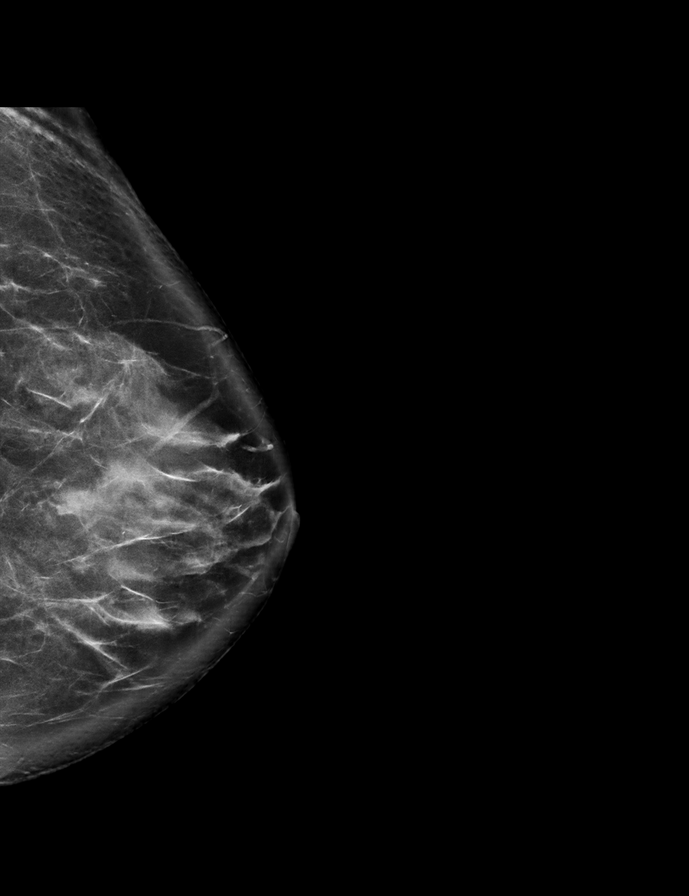

[R CC synth-2D]
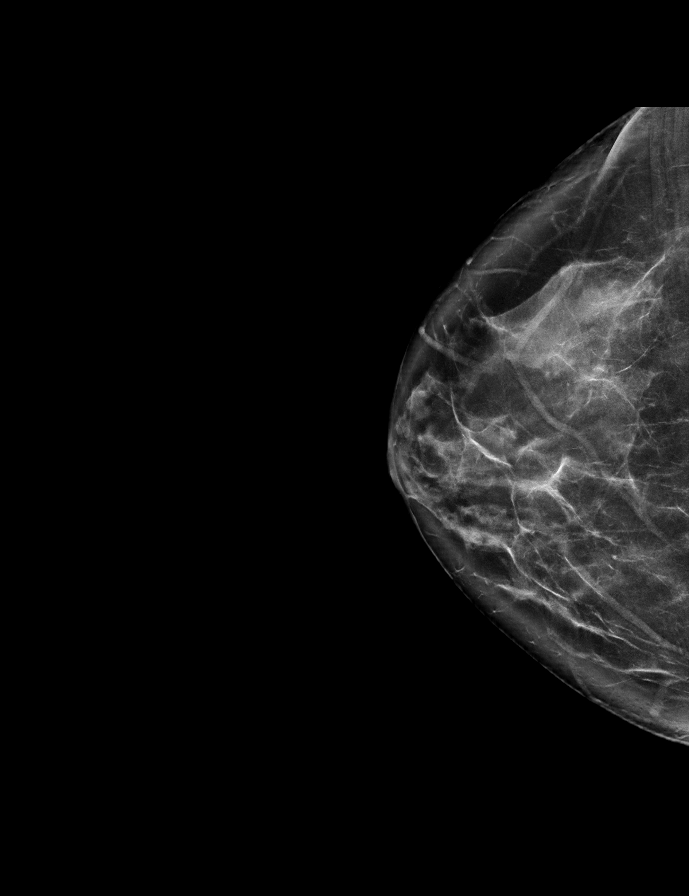

[R MLO synth-2D]
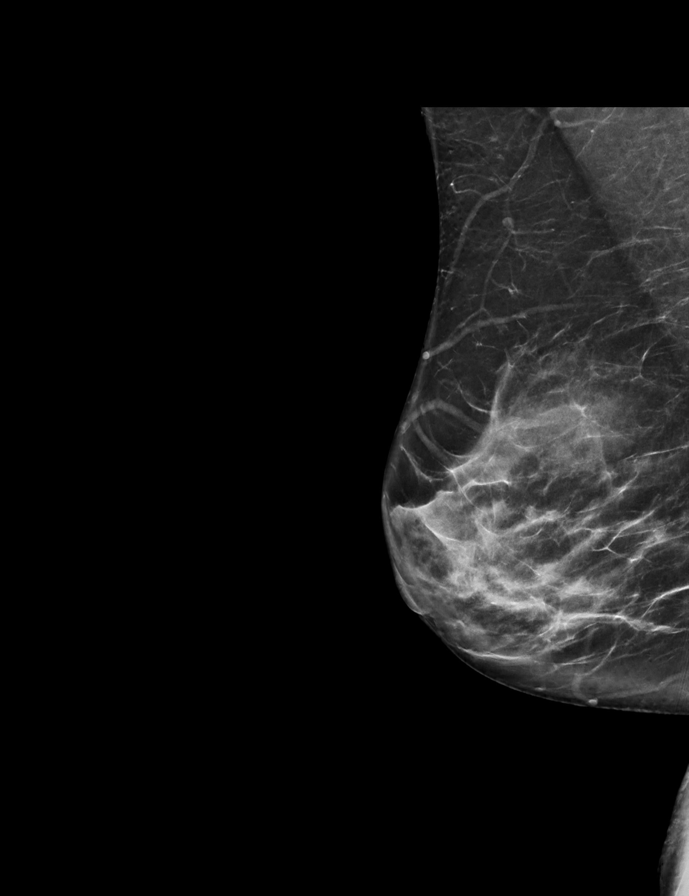

[R MLO tomo · 2 of 76 frames shown]
[frame 25/76]
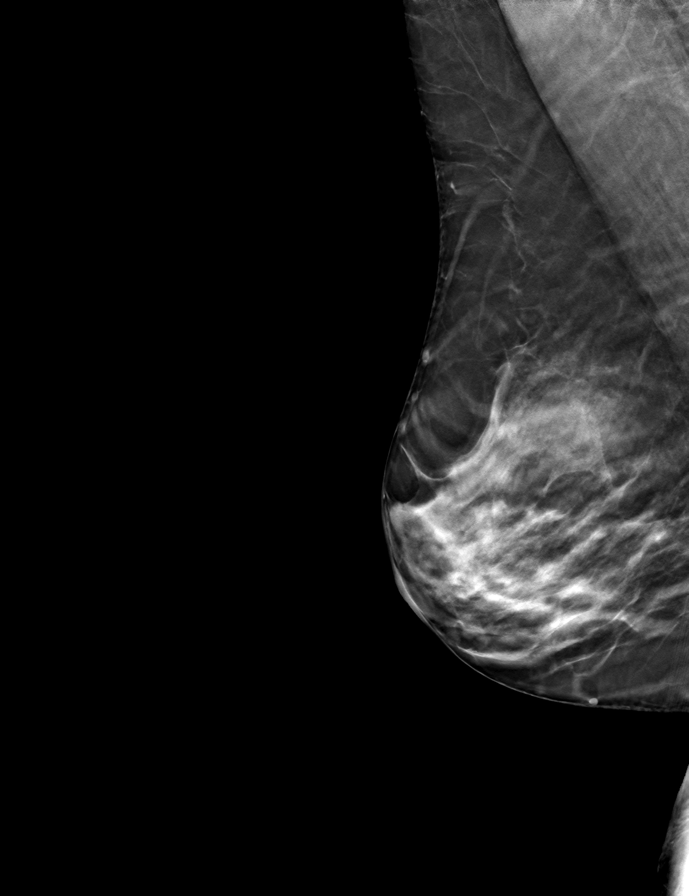
[frame 39/76]
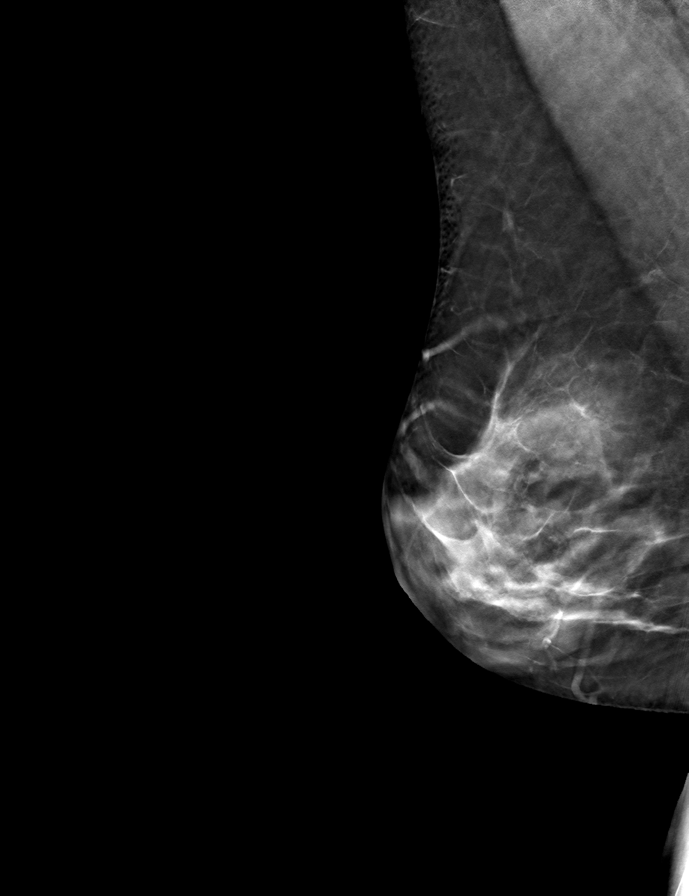

[R CC tomo · tomo slice 39/76.0]
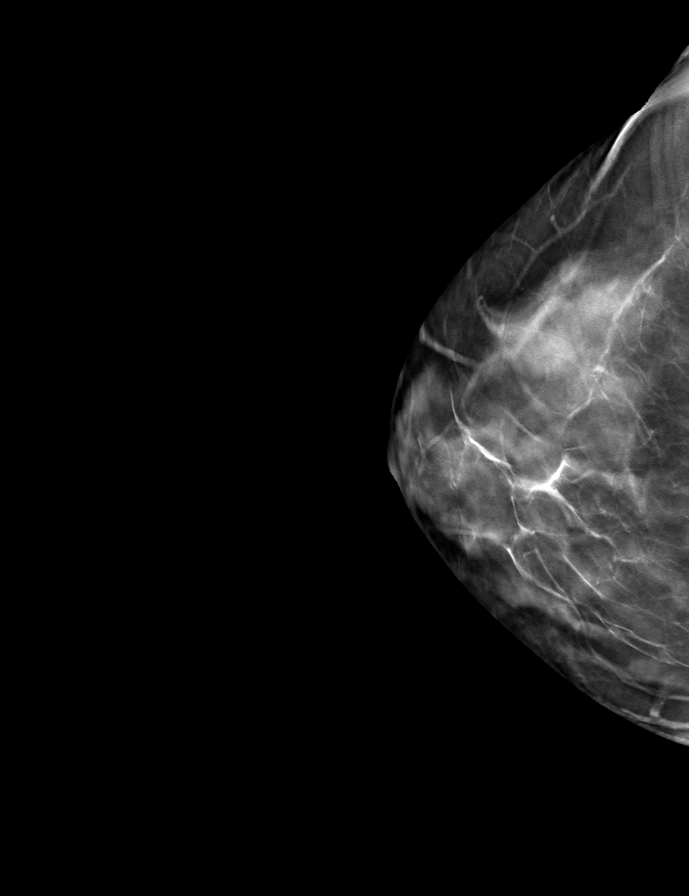

[L MLO tomo · tomo slice 41/82.0]
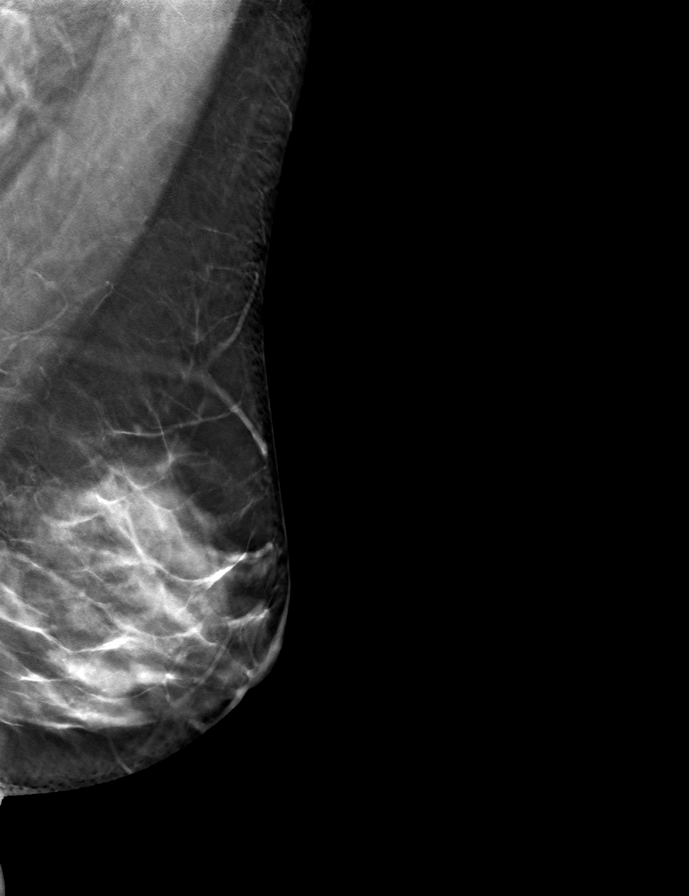

[L CC tomo · tomo slice 45/88.0]
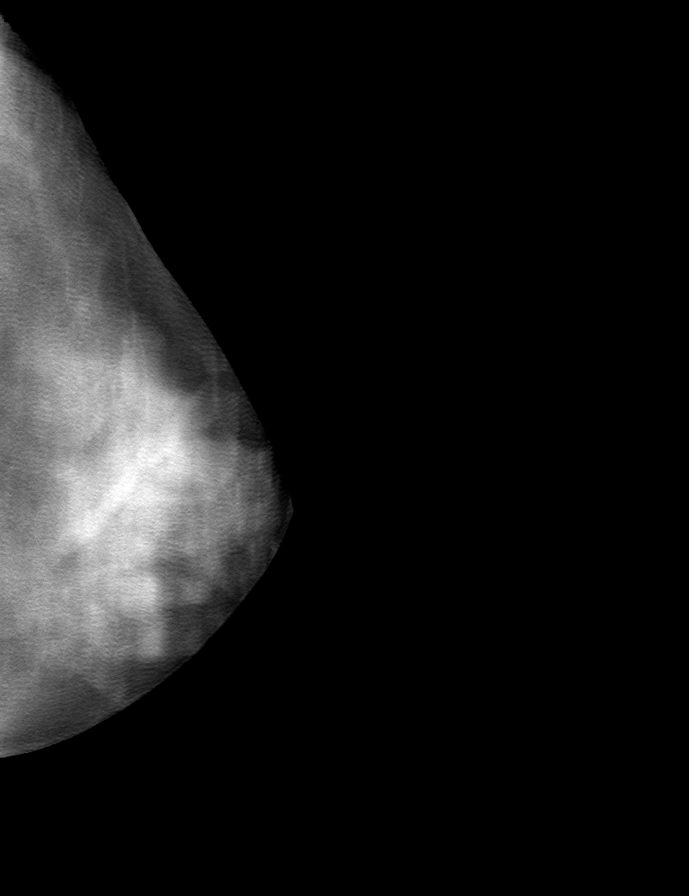

[9 of 24 positions shown; findings below may reference images not displayed]

ACR Breast Density Category c: The breast tissue is heterogeneously
dense, which may obscure small masses.
FINDINGS: There are no findings suspicious for malignancy.
IMPRESSION: No mammographic evidence of malignancy. A result letter of this
screening mammogram will be mailed directly to the patient.

RECOMMENDATION:
Screening mammogram in one year. (Code:[id number]-W-BC3)

BI-RADS CATEGORY  1: Negative.
